# Patient Record
Sex: Female | Born: 1935 | Race: White | Hispanic: No
Health system: Southern US, Community
[De-identification: ages and names within clinical notes are randomized; demographics above are authoritative.]

## PROBLEM LIST (undated history)

## (undated) DIAGNOSIS — G4700 Insomnia, unspecified: Secondary | ICD-10-CM

## (undated) DIAGNOSIS — I10 Essential (primary) hypertension: Secondary | ICD-10-CM

## (undated) DIAGNOSIS — E039 Hypothyroidism, unspecified: Secondary | ICD-10-CM

## (undated) DIAGNOSIS — F0390 Unspecified dementia without behavioral disturbance: Secondary | ICD-10-CM

---

## 2016-11-29 ENCOUNTER — Emergency Department (HOSPITAL_COMMUNITY): Payer: MEDICARE

## 2016-11-29 ENCOUNTER — Emergency Department (HOSPITAL_COMMUNITY)
Admission: EM | Admit: 2016-11-29 | Discharge: 2016-11-29 | Disposition: A | Discharge status: Home / Self Care | Payer: MEDICARE | Attending: Emergency Medicine | Admitting: Emergency Medicine

## 2016-11-29 ENCOUNTER — Encounter (HOSPITAL_COMMUNITY): Payer: Self-pay | Admitting: Emergency Medicine

## 2016-11-29 DIAGNOSIS — E039 Hypothyroidism, unspecified: Secondary | ICD-10-CM | POA: Insufficient documentation

## 2016-11-29 DIAGNOSIS — M25512 Pain in left shoulder: Secondary | ICD-10-CM | POA: Diagnosis present

## 2016-11-29 DIAGNOSIS — W19XXXA Unspecified fall, initial encounter: Secondary | ICD-10-CM

## 2016-11-29 DIAGNOSIS — I10 Essential (primary) hypertension: Secondary | ICD-10-CM | POA: Diagnosis not present

## 2016-11-29 DIAGNOSIS — F0390 Unspecified dementia without behavioral disturbance: Secondary | ICD-10-CM | POA: Insufficient documentation

## 2016-11-29 HISTORY — DX: Insomnia, unspecified: G47.00

## 2016-11-29 HISTORY — DX: Essential (primary) hypertension: I10

## 2016-11-29 HISTORY — DX: Hypothyroidism, unspecified: E03.9

## 2016-11-29 HISTORY — DX: Unspecified dementia, unspecified severity, without behavioral disturbance, psychotic disturbance, mood disturbance, and anxiety: F03.90
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
## 2016-11-29 NOTE — Discharge Instructions (Signed)
Her imaging shows an old fracture of her left humerus. Likely not from fall today. Needs to be followed by ortho if not already. No other acute fracture noted. Follow up with pcp as needed.
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
## 2019-02-23 IMAGING — CR DG HIP (WITH OR WITHOUT PELVIS) 2-3V*L*
3 series · 3 of 3 positions shown · non-contrast
Comparison: None.

CLINICAL DATA: Pain following fall

EXAM:
DG HIP (WITH OR WITHOUT PELVIS) 2-3V LEFT

[x pelvis]
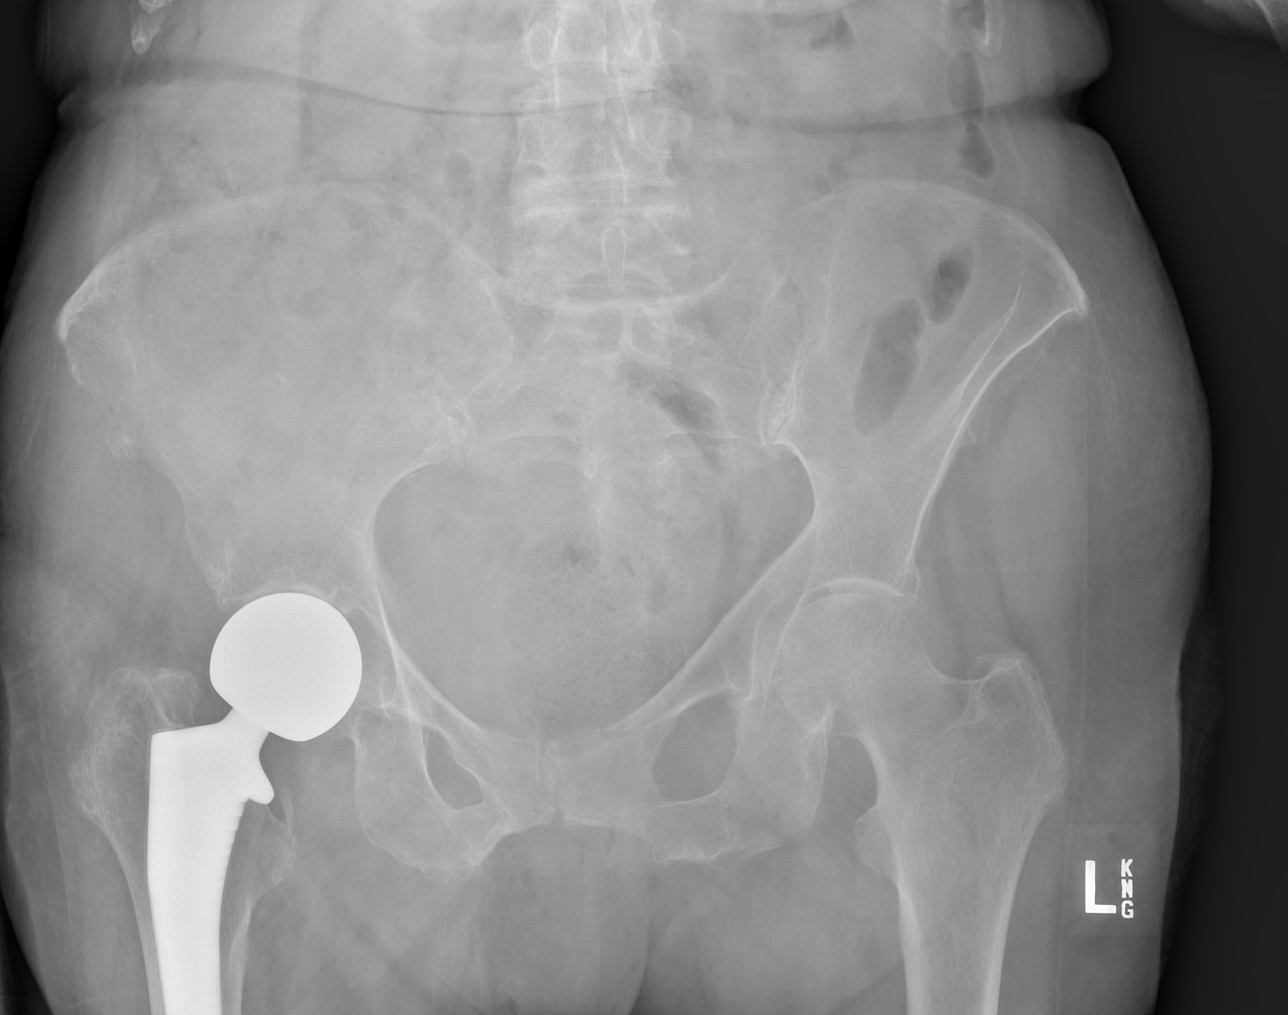

[x hip ap left]
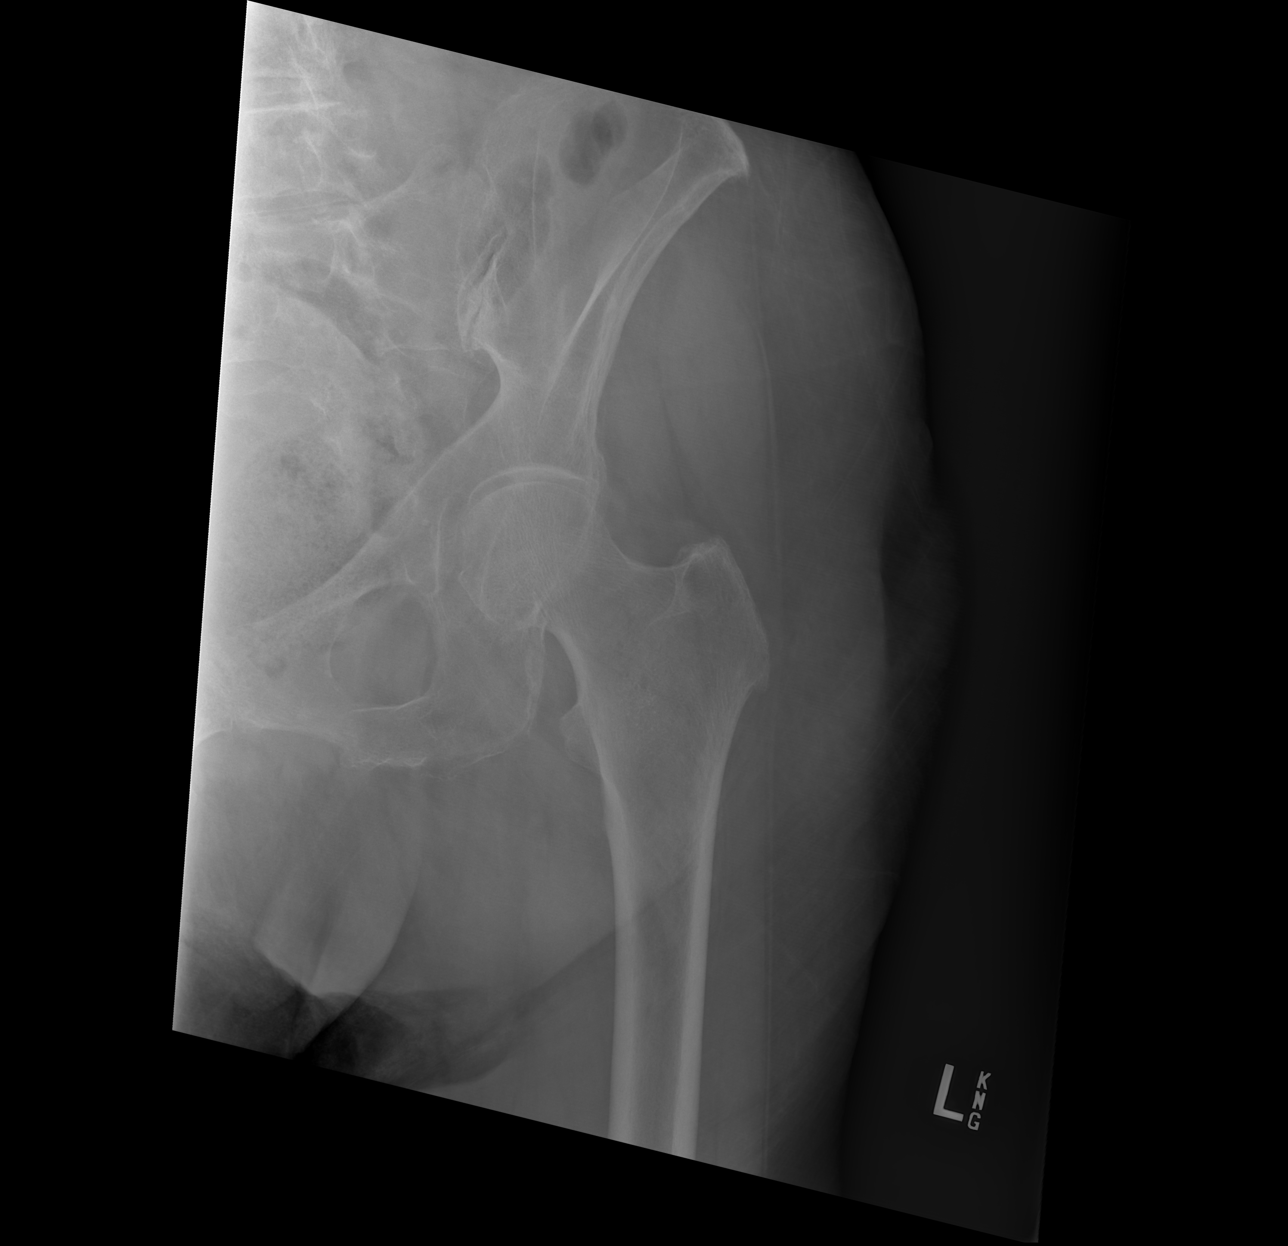

[x hip lat left]
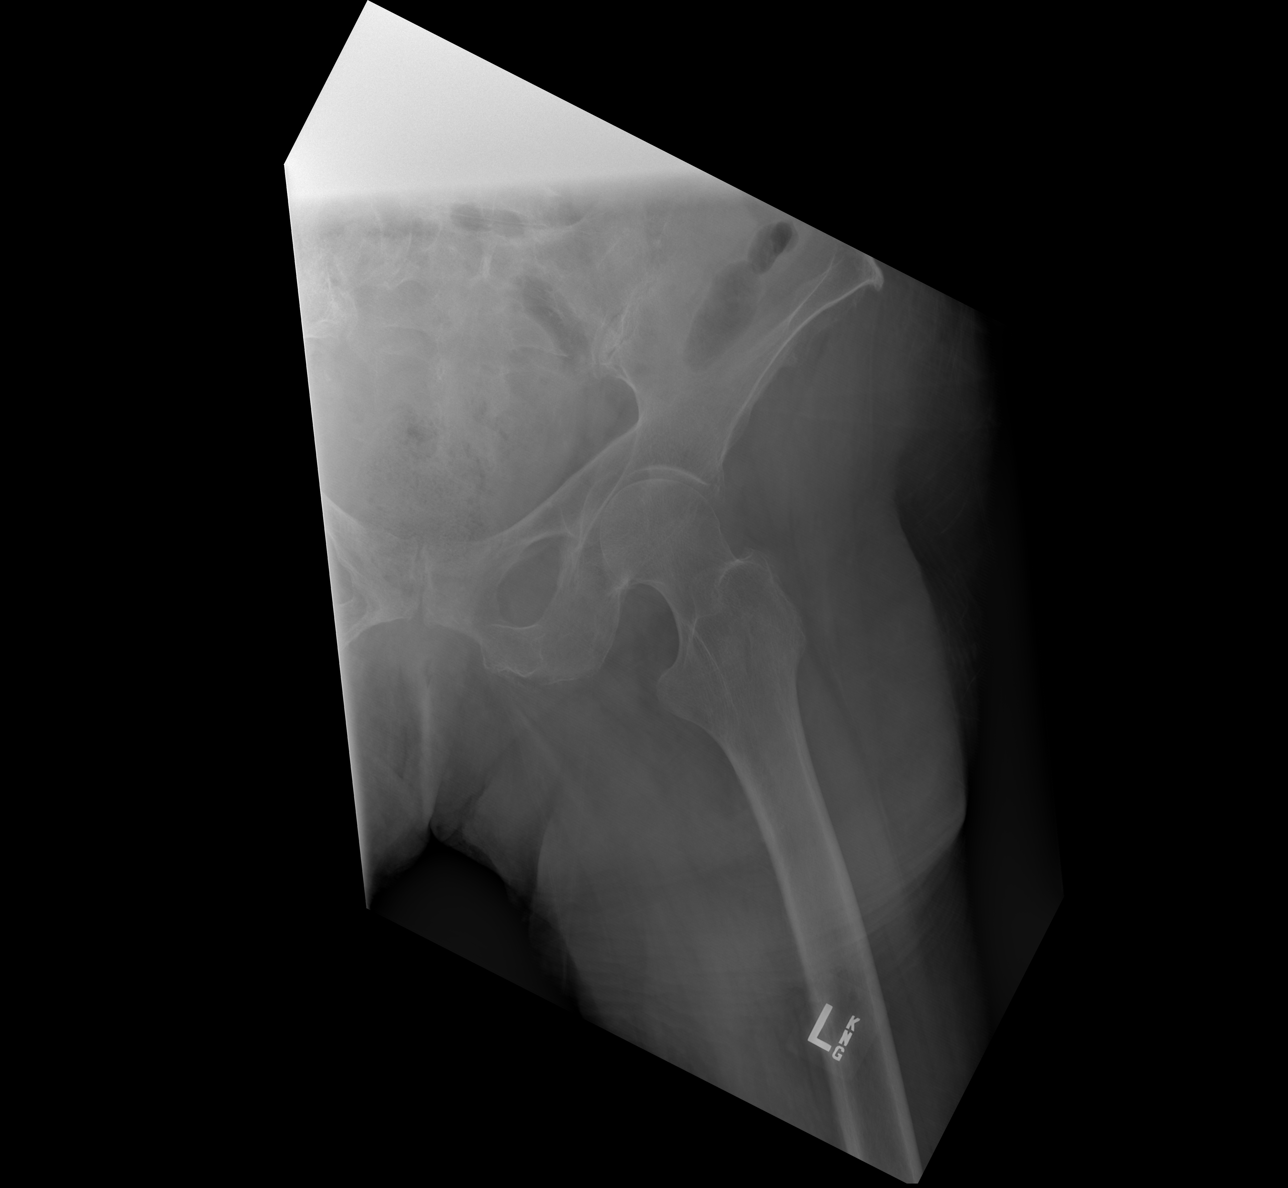

[3 of 3 positions shown; findings below may reference images not displayed]

FINDINGS: Frontal pelvis as well as frontal and lateral left hip images were
obtained. There is a total hip replacement on the right with
prosthetic component well-seated. There is no acute fracture or
dislocation. There is mild narrowing of the left hip joint. There is
osteoarthritic change in each sacroiliac joint.
IMPRESSION: Total hip replacement on the right with prosthetic component
well-seated. No acute fracture or dislocation. Mild narrowing left
hip joint. Moderate osteoarthritic change in both sacroiliac joints.

## 2019-02-23 IMAGING — CR DG SHOULDER 2+V*L*
2 series · 2 of 2 positions shown · non-contrast
Comparison: None.

CLINICAL DATA: Pain following fall

EXAM:
LEFT SHOULDER - 2+ VIEW

[x shoulder ap left (1 of 2)]
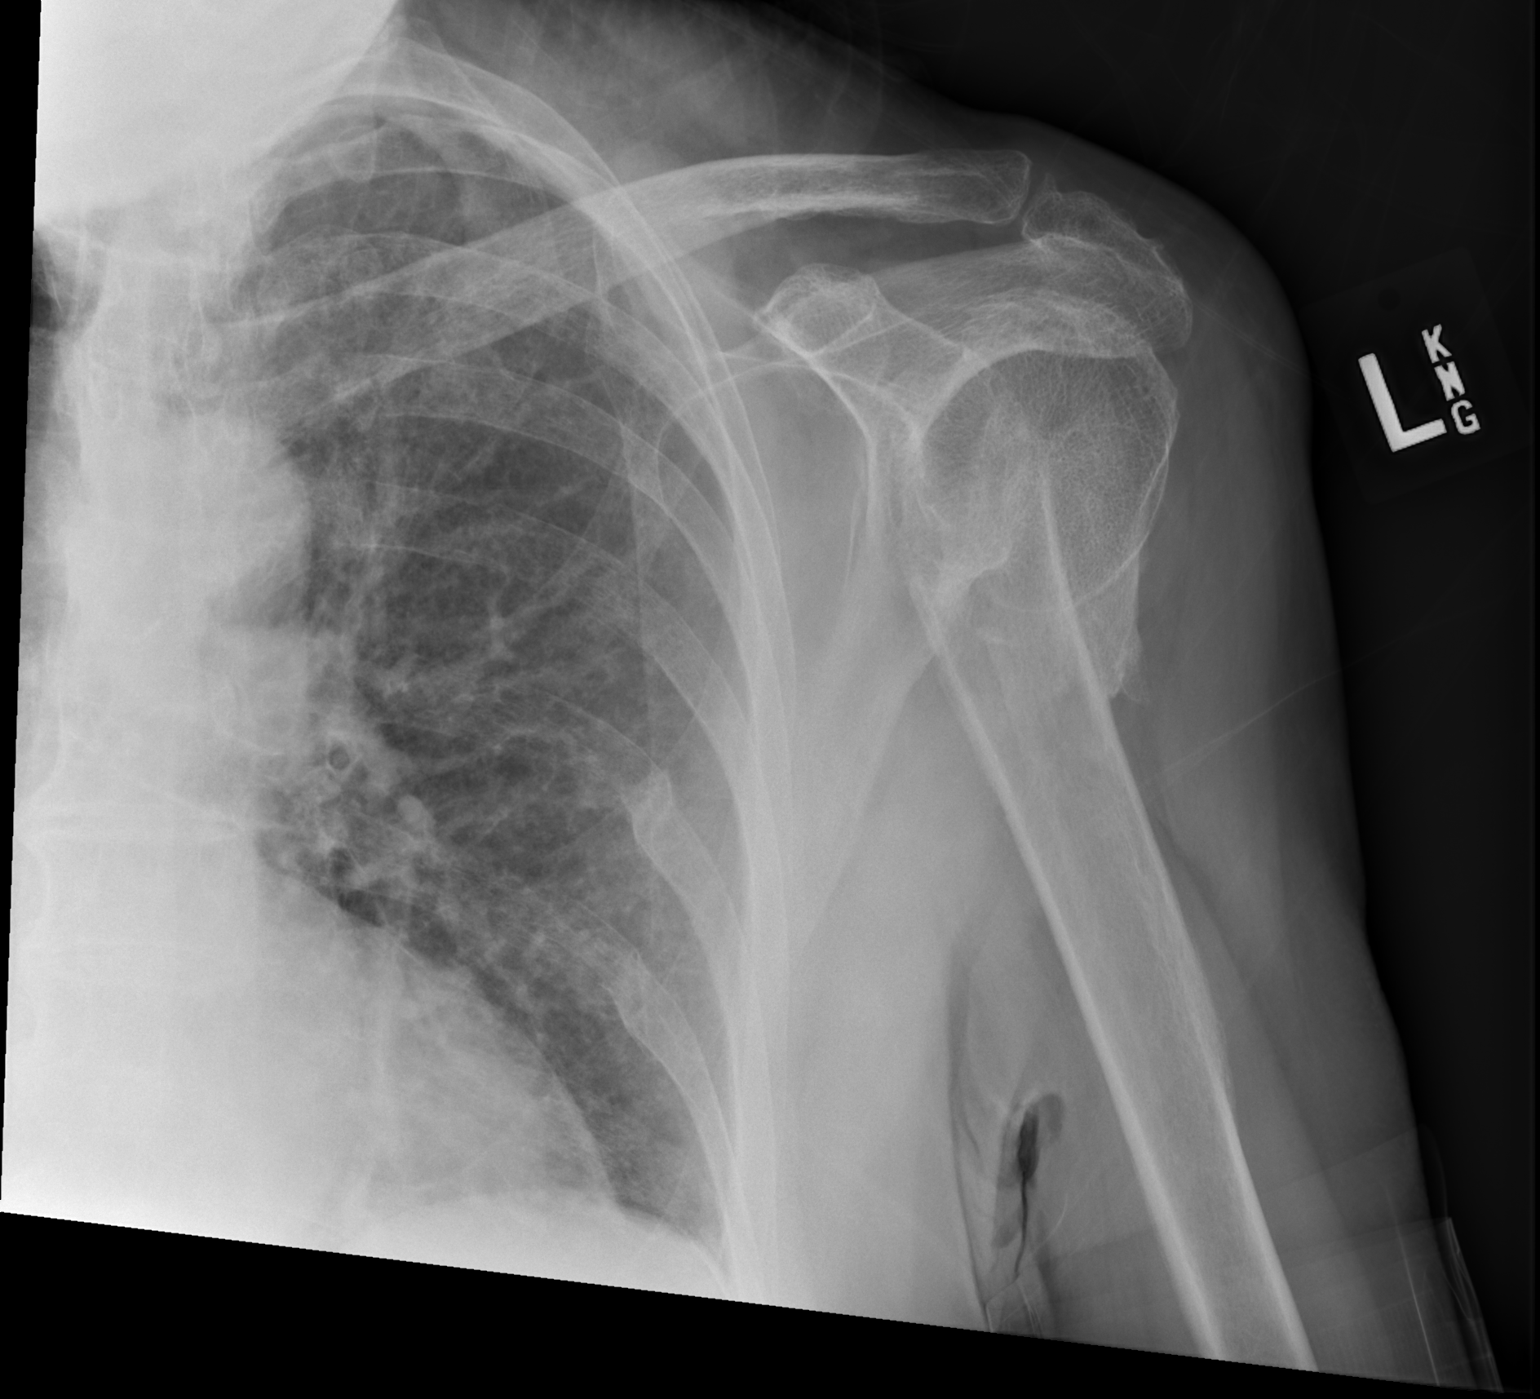

[x shoulder ap left (2 of 2)]
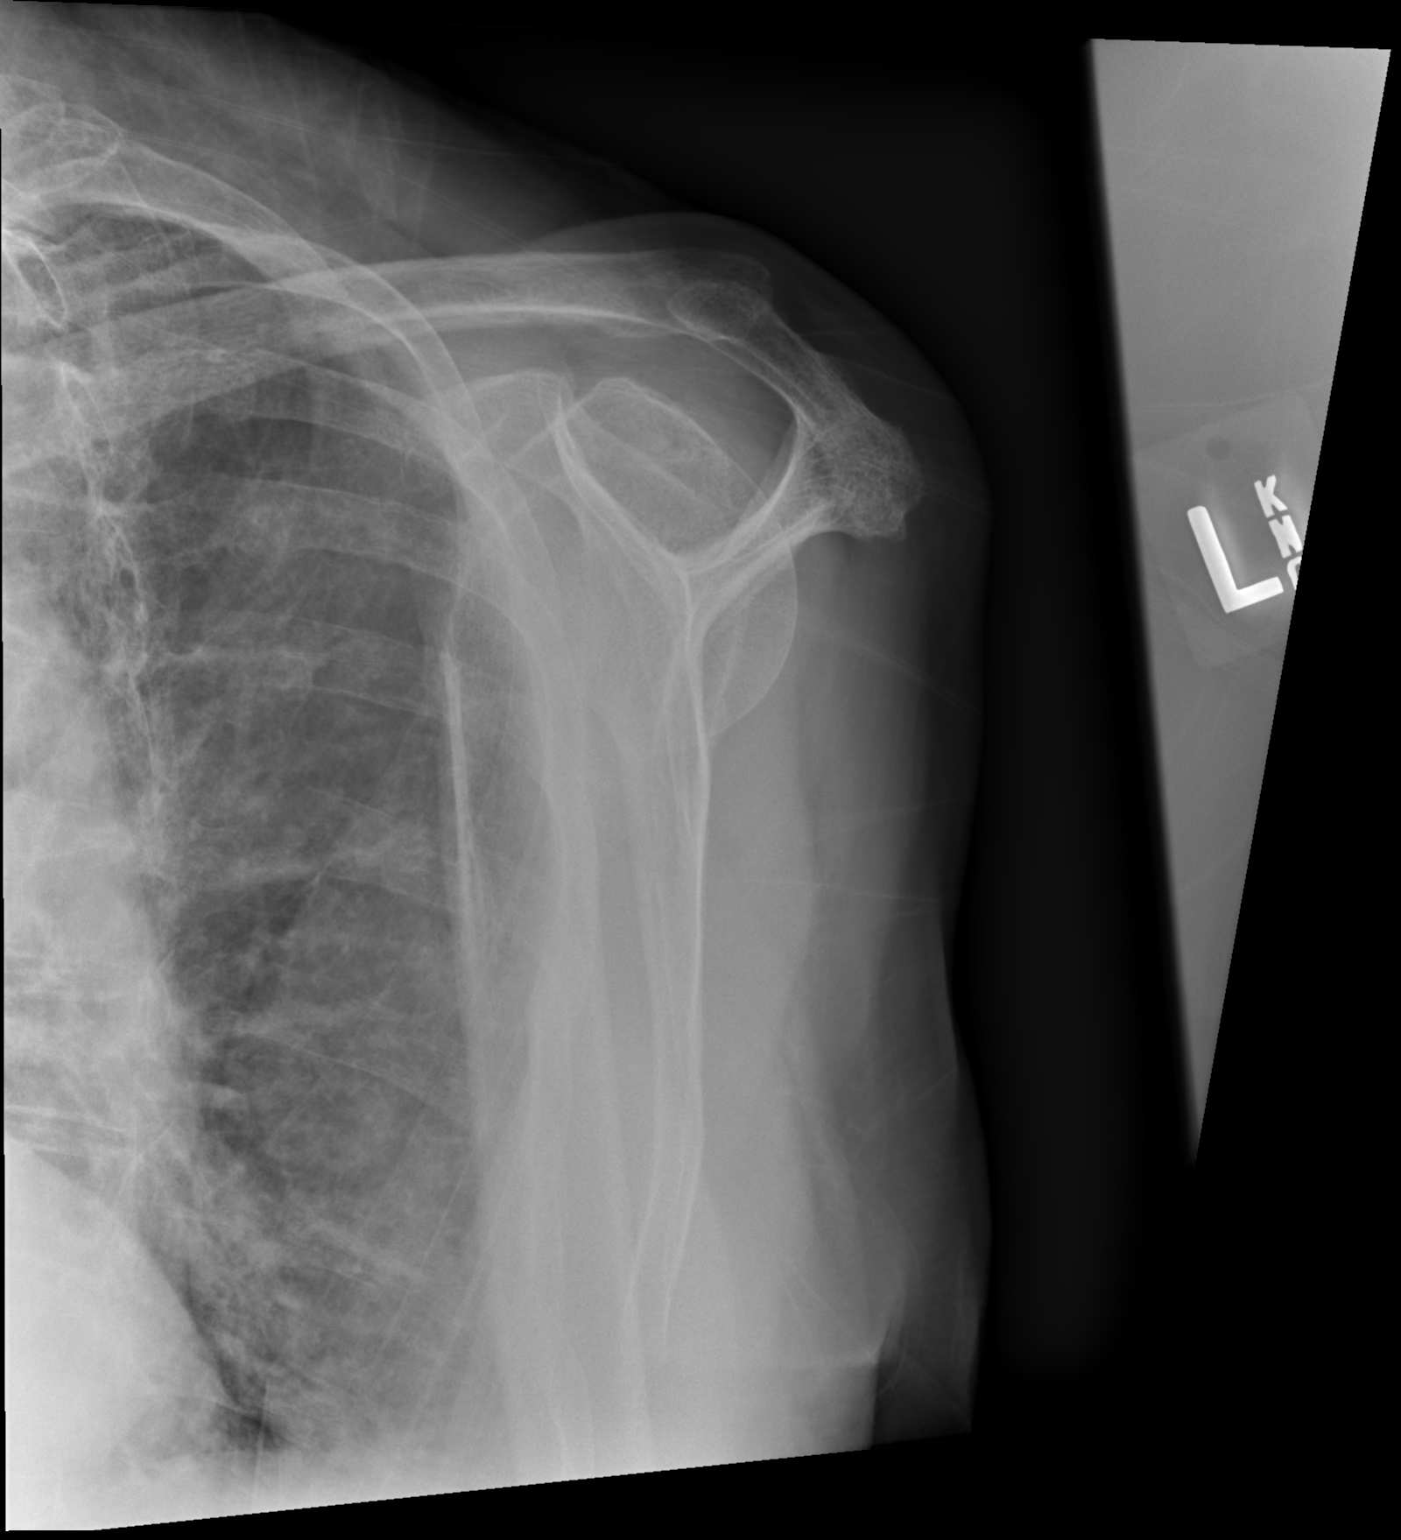

[2 of 2 positions shown; findings below may reference images not displayed]

FINDINGS: Frontal and Y scapular images obtained. There is an old healed
fracture of the proximal humeral diaphysis with remodeling. No acute
fracture or dislocation. There is moderate generalized
osteoarthritic change in left shoulder. No erosive change. There are
healed fractures of the posterolateral left seventh and eighth ribs.
Visualized left lung is clear.
IMPRESSION: Old fracture proximal left humerus with remodeling. Old healed rib
fractures on the left. No acute fracture or dislocation. Moderate
generalized osteoarthritic change noted in the left shoulder.

## 2020-11-15 DEATH — deceased
